# Patient Record
Sex: Female | Born: 1960 | Race: White | Hispanic: No | Marital: Married | Smoking: Never smoker
Health system: Southern US, Community
[De-identification: ages and names within clinical notes are randomized; demographics above are authoritative.]

## PROBLEM LIST (undated history)

## (undated) HISTORY — PX: TONSILLECTOMY: SUR1361

---

## 2004-12-20 ENCOUNTER — Ambulatory Visit: Payer: Self-pay | Admitting: Unknown Physician Specialty

## 2005-02-03 ENCOUNTER — Ambulatory Visit: Payer: Self-pay | Admitting: Unknown Physician Specialty

## 2014-09-20 ENCOUNTER — Emergency Department: Payer: BLUE CROSS/BLUE SHIELD

## 2014-09-20 ENCOUNTER — Encounter: Payer: Self-pay | Admitting: Emergency Medicine

## 2014-09-20 ENCOUNTER — Emergency Department
Admission: EM | Admit: 2014-09-20 | Discharge: 2014-09-20 | Disposition: A | Discharge status: Home / Self Care | Payer: BLUE CROSS/BLUE SHIELD | Attending: Emergency Medicine | Admitting: Emergency Medicine

## 2014-09-20 DIAGNOSIS — R079 Chest pain, unspecified: Secondary | ICD-10-CM | POA: Diagnosis not present

## 2014-09-20 DIAGNOSIS — R61 Generalized hyperhidrosis: Secondary | ICD-10-CM | POA: Diagnosis not present

## 2014-09-20 DIAGNOSIS — M549 Dorsalgia, unspecified: Secondary | ICD-10-CM | POA: Insufficient documentation

## 2014-09-20 DIAGNOSIS — Z79899 Other long term (current) drug therapy: Secondary | ICD-10-CM | POA: Diagnosis not present

## 2014-09-20 DIAGNOSIS — R202 Paresthesia of skin: Secondary | ICD-10-CM | POA: Diagnosis not present

## 2014-09-20 DIAGNOSIS — R110 Nausea: Secondary | ICD-10-CM | POA: Insufficient documentation

## 2014-09-20 DIAGNOSIS — Z791 Long term (current) use of non-steroidal anti-inflammatories (NSAID): Secondary | ICD-10-CM | POA: Insufficient documentation

## 2014-09-20 DIAGNOSIS — R0602 Shortness of breath: Secondary | ICD-10-CM | POA: Insufficient documentation

## 2014-09-20 LAB — BASIC METABOLIC PANEL
ANION GAP: 8 (ref 5–15)
BUN: 10 mg/dL (ref 6–20)
CALCIUM: 8.8 mg/dL — AB (ref 8.9–10.3)
CO2: 27 mmol/L (ref 22–32)
CREATININE: 0.68 mg/dL (ref 0.44–1.00)
Chloride: 105 mmol/L (ref 101–111)
GFR calc Af Amer: >60 mL/min (ref >60)
GLUCOSE: 128 mg/dL — AB (ref 65–99)
Potassium: 4.2 mmol/L (ref 3.5–5.1)
Sodium: 140 mmol/L (ref 135–145)

## 2014-09-20 LAB — TROPONIN I

## 2014-09-20 LAB — CBC
HCT: 38.6 % (ref 35.0–47.0)
Hemoglobin: 12.9 g/dL (ref 12.0–16.0)
MCH: 34.2 pg — ABNORMAL HIGH (ref 26.0–34.0)
MCHC: 33.5 g/dL (ref 32.0–36.0)
MCV: 102.1 fL — ABNORMAL HIGH (ref 80.0–100.0)
PLATELETS: 271 10*3/uL (ref 150–440)
RBC: 3.78 MIL/uL — ABNORMAL LOW (ref 3.80–5.20)
RDW: 13.7 % (ref 11.5–14.5)
WBC: 6 10*3/uL (ref 3.6–11.0)

## 2014-09-20 LAB — FIBRIN DERIVATIVES D-DIMER (ARMC ONLY): Fibrin derivatives D-dimer (ARMC): 402 (ref 0–499)

## 2014-09-20 NOTE — ED Notes (Addendum)
[place] way home in the car she began to have chest pain and back pain along with SOB. Pt also states she has Numbness and tingling in her left arm Pt states I feel like I cant take a deep breath.

## 2014-09-20 NOTE — ED Notes (Signed)
[place]. Was driving home and began to have pain to left back, left shoulder and left chest. States she got home and panicked. Thinks now it may be more musculoskeletal. Possible rib fracture.

## 2014-09-20 NOTE — Discharge Instructions (Signed)

## 2014-09-20 NOTE — ED Provider Notes (Signed)
[facility] Emergency Department Provider Note  ____________________________________________  Time seen: Approximately 650 PM  I have reviewed the triage vital signs and the nursing notes.   HISTORY  Chief Complaint Chest Pain    HPI Alicia Little is a 54 y.o. female without any chronic medical problems who is presenting today with an episode of chest pain. She says that last week he injured her back when she bumped it up against a dock. She said that she was resting her back over the course the week but then today was lifting some heavy rocks. After lifting the rocks, at 4 PM she began to have back as well as chest pain. She describes the chest pain as a tightness and is associated with shortness of breath.She said that the pain lasted for about an hour and then gradually went away. However, she still has pain to the left chest with deep breathing. She says that during this episode of pain she also with left arm tingling as well as nausea and diaphoresis. She denies any personal or family history of cardiac disease. She denies smoking or drinking. Denies any drug use.   History reviewed. No pertinent past medical history.  There are no active problems to display for this patient.   Past Surgical History  Procedure Laterality Date  . Tonsillectomy      Current Outpatient Rx  Name  Route  Sig  Dispense  Refill  . cycloSPORINE (RESTASIS) 0.05 % ophthalmic emulsion   Ophthalmic   Apply 1 drop to eye daily.         . naproxen sodium (ALEVE) 220 MG tablet   Oral   Take 440 mg by mouth once.         Marland Kitchen zolpidem (AMBIEN) 5 MG tablet   Oral   Take 2.5 mg by mouth at bedtime as needed. For sleep.      5     Allergies Review of patient's allergies indicates no known allergies.  No family history on file.  Social History Social History  Substance Use Topics  . Smoking status: Never Smoker   . Smokeless tobacco: Never Used  . Alcohol Use: 0.6  oz/week    1 Glasses of wine per week    Review of Systems Constitutional: No fever/chills Eyes: No visual changes. ENT: No sore throat. Cardiovascular: As above  Respiratory: As above Gastrointestinal: No abdominal pain.    No diarrhea.  No constipation. Genitourinary: Negative for dysuria. Musculoskeletal: Negative for back pain. Skin: Negative for rash. Neurological: Negative for headaches, focal weakness or numbness.  10-point ROS otherwise negative.  ____________________________________________   PHYSICAL EXAM:  VITAL SIGNS: ED Triage Vitals  Enc Vitals Group     BP 09/20/14 1721 114/67 mmHg     Pulse Rate 09/20/14 1721 73     Resp 09/20/14 1721 18     Temp 09/20/14 1721 97.9 F (36.6 C)     Temp Source 09/20/14 1721 Oral     SpO2 09/20/14 1721 100 %     Weight 09/20/14 1721 111 lb (50.349 kg)     Height 09/20/14 1721 5\' 4"  (1.626 m)     Head Cir --      Peak Flow --      Pain Score 09/20/14 1714 1     Pain Loc --      Pain Edu? --      Excl. in GC? --     Constitutional: Alert and oriented. Well appearing and  in no acute distress. Eyes: Conjunctivae are normal. PERRL. EOMI. Head: Atraumatic. Nose: No congestion/rhinnorhea. Mouth/Throat: Mucous membranes are moist.  Oropharynx non-erythematous. Neck: No stridor.   Cardiovascular: Normal rate, regular rhythm. Grossly normal heart sounds.  Good peripheral circulation. Chest pain is not reproducible. There are intact and equal bilateral radial as well as dorsalis pedis pulses. Respiratory: Normal respiratory effort.  No retractions. Lungs CTAB. Gastrointestinal: Soft and nontender. No distention. No abdominal bruits. No CVA tenderness. Musculoskeletal: No lower extremity tenderness nor edema.  No joint effusions. Neurologic:  Normal speech and language. No gross focal neurologic deficits are appreciated. No gait instability. Skin:  Skin is warm, dry and intact. No rash noted. Psychiatric: Mood and affect are  normal. Speech and behavior are normal.  ____________________________________________   LABS (all labs ordered are listed, but only abnormal results are displayed)  Labs Reviewed  BASIC METABOLIC PANEL - Abnormal; Notable for the following:    Glucose, Bld 128 (*)    Calcium 8.8 (*)    All other components within normal limits  CBC - Abnormal; Notable for the following:    RBC 3.78 (*)    MCV 102.1 (*)    MCH 34.2 (*)    All other components within normal limits  TROPONIN I  TROPONIN I  FIBRIN DERIVATIVES D-DIMER ([facility] ONLY)   ____________________________________________  EKG  ED ECG REPORT I, Arelia Longest, the attending physician, personally viewed and interpreted this ECG.   Date: 09/20/2014  EKG Time: 1714  Rate: 70  Rhythm: normal sinus rhythm  Axis: Normal axis  Intervals:none  ST&T Change: No ST elevations or depressions. No abnormal T-wave inversions.  ____________________________________________  RADIOLOGY  No acute abnormality on the chest x-ray or CAT scan of the brain. I personally reviewed the plain films. ____________________________________________   PROCEDURES   ____________________________________________   INITIAL IMPRESSION / ASSESSMENT AND PLAN / ED COURSE  Pertinent labs & imaging results that were available during my care of the patient were reviewed by me and considered in my medical decision making (see chart for details).  Patient low risk for pulmonary embolus as well as acute coronary syndrome. However, does have some classic features including diaphoresis as well as shortness of breath and nausea. Also with pleuritic chest pain. This could be all musculoskeletal and I favor this secondary to her recent musculoskeletal injury as well as exertion. However, I cannot use the PERC rule to rule out her out because she is over 55 years old. We will check a second troponin as well as test for a  d-dimer.  ----------------------------------------- 9:23 PM on 09/20/2014 -----------------------------------------  She resting comfortably now with negative d-dimer as well as negative troponin. We'll discharge to home. Extremely low risk for cardiac pathology as well as pulmonary embolus. Possible panic attack. Patient says was feeling "out of control" when she was at home because of the pain that she was having. Possible anxiety versus panic reaction. Will follow up with primary care doctor we'll discharge to home. Likely musculoskeletal pain causing chest and back pain given recent injury and exertion.  ____________________________________________   FINAL CLINICAL IMPRESSION(S) / ED DIAGNOSES  Acute chest pain. Initial visit.    Myrna Blazer, MD 09/20/14 2124

## 2014-10-13 ENCOUNTER — Other Ambulatory Visit: Payer: Self-pay | Admitting: Internal Medicine

## 2014-10-13 DIAGNOSIS — R0789 Other chest pain: Secondary | ICD-10-CM

## 2014-10-14 ENCOUNTER — Ambulatory Visit
Admission: RE | Admit: 2014-10-14 | Discharge: 2014-10-14 | Disposition: A | Discharge status: Home / Self Care | Payer: BLUE CROSS/BLUE SHIELD | Source: Ambulatory Visit | Attending: Internal Medicine | Admitting: Internal Medicine

## 2014-10-14 DIAGNOSIS — R911 Solitary pulmonary nodule: Secondary | ICD-10-CM | POA: Insufficient documentation

## 2014-10-14 DIAGNOSIS — R0789 Other chest pain: Secondary | ICD-10-CM | POA: Diagnosis present

## 2014-10-14 MED ORDER — IOHEXOL 350 MG/ML SOLN
85.0000 mL | Freq: Once | INTRAVENOUS | Status: AC | PRN
  Administered 2014-10-14: 85 mL via INTRAVENOUS

## 2017-02-09 IMAGING — CT CT HEAD W/O CM
1 series · 16 of 30 positions shown, 20 images · non-contrast
Comparison: None.

CLINICAL DATA: Sudden onset of LEFT-sided arm numbness, patient
fell 1 week ago.

EXAM:
CT HEAD WITHOUT CONTRAST
TECHNIQUE: Contiguous axial images were obtained from the base of the skull
through the vertex without intravenous contrast.

[Series 2: head wo · axial · 0.45mm/px · z∈[-179,-48]mm · 16 of 30 slices shown, 20 images]
[im 2/30  brain]
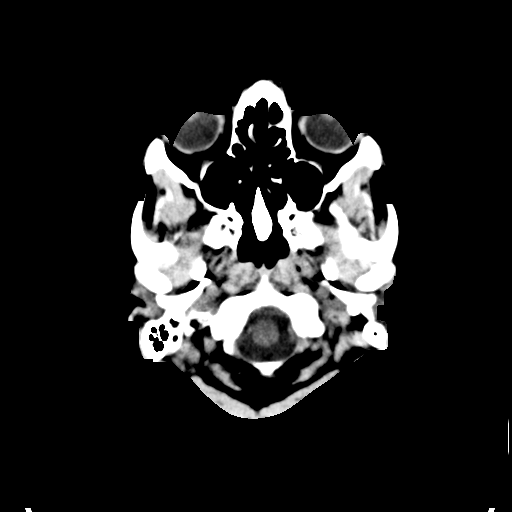
[im 2/30  bone]
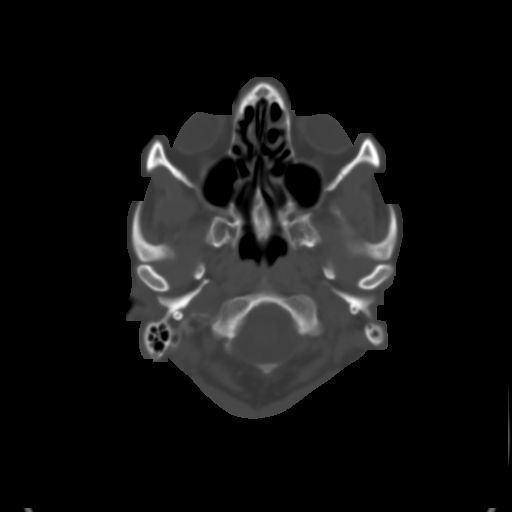
[im 4/30  brain]
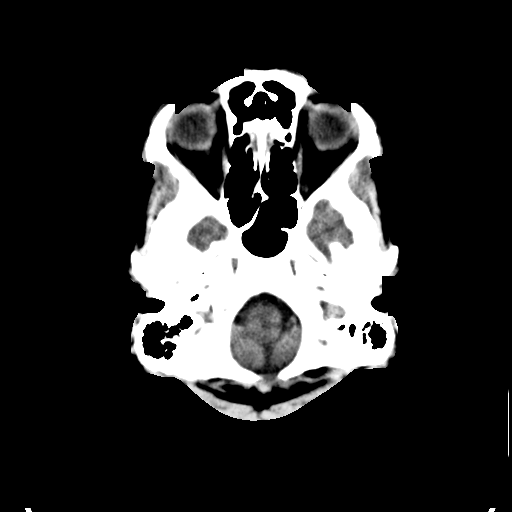
[im 6/30  brain]
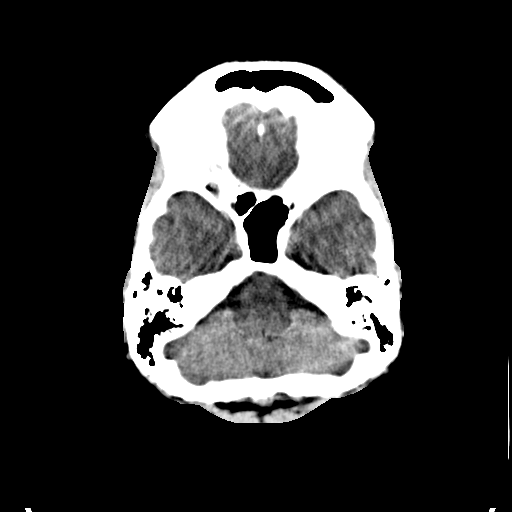
[im 8/30  brain]
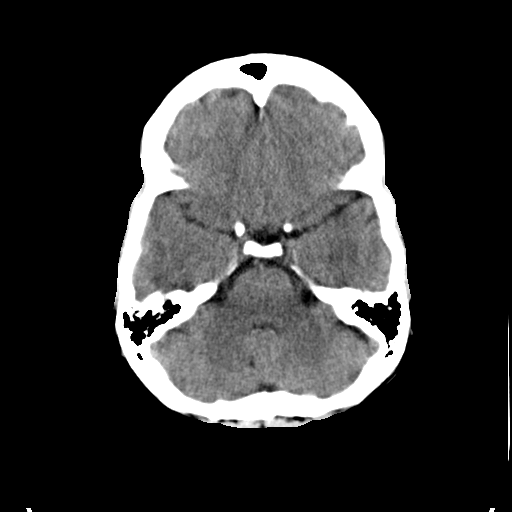
[im 9/30  brain]
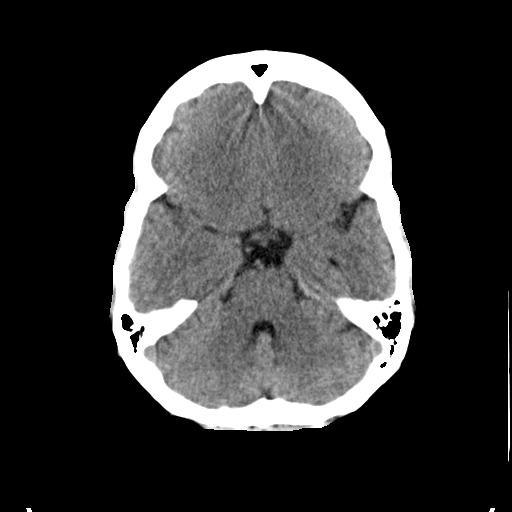
[im 9/30  bone]
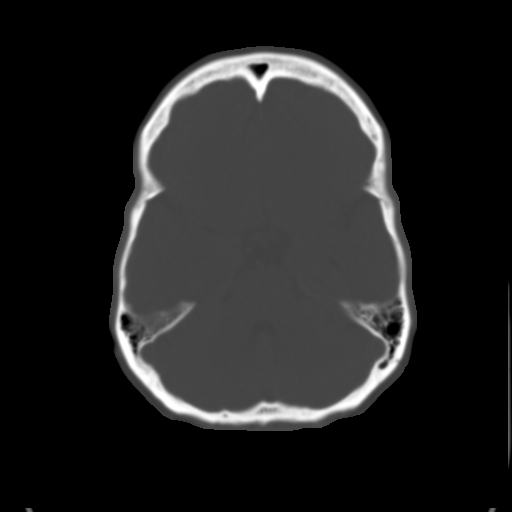
[im 11/30  brain]
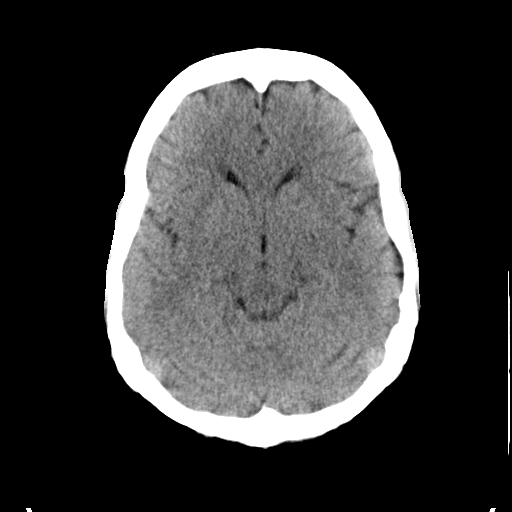
[im 13/30  brain]
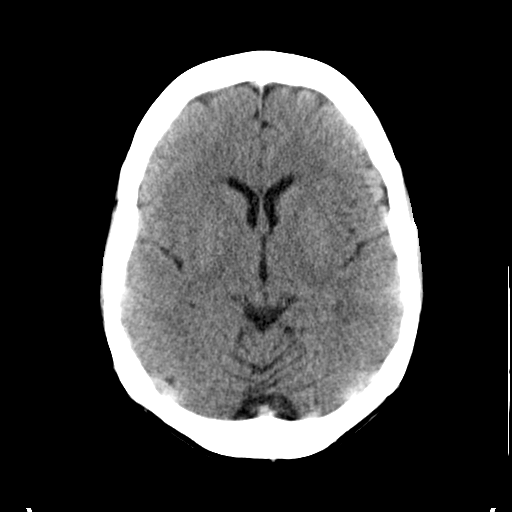
[im 15/30  brain]
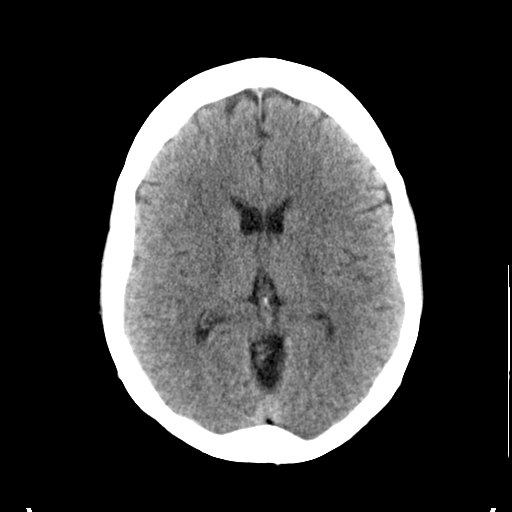
[im 16/30  brain]
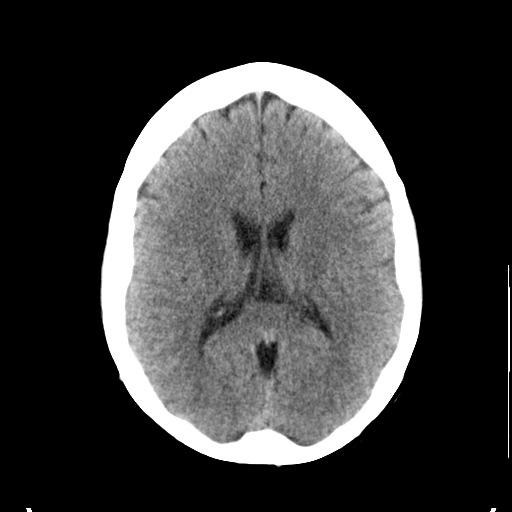
[im 16/30  bone]
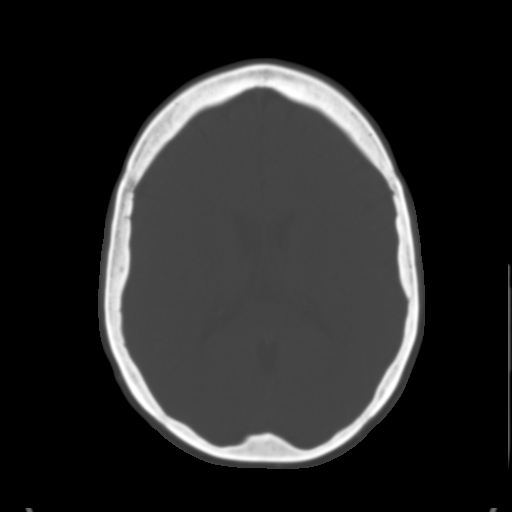
[im 18/30  brain]
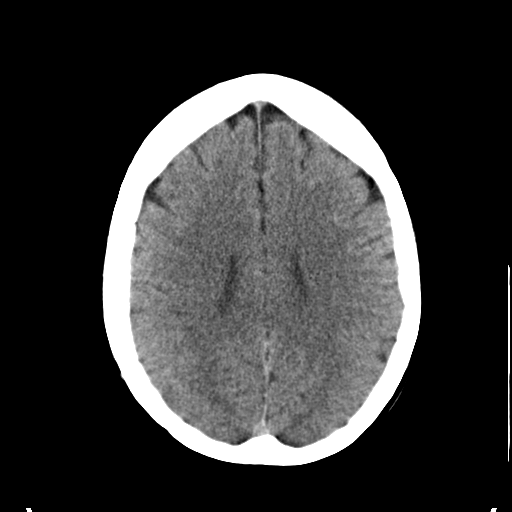
[im 20/30  brain]
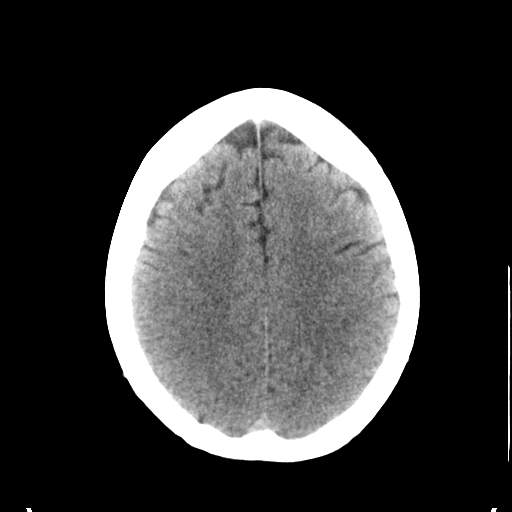
[im 22/30  brain]
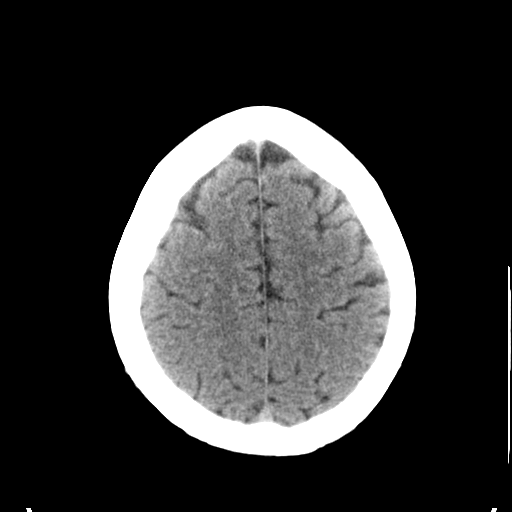
[im 23/30  brain]
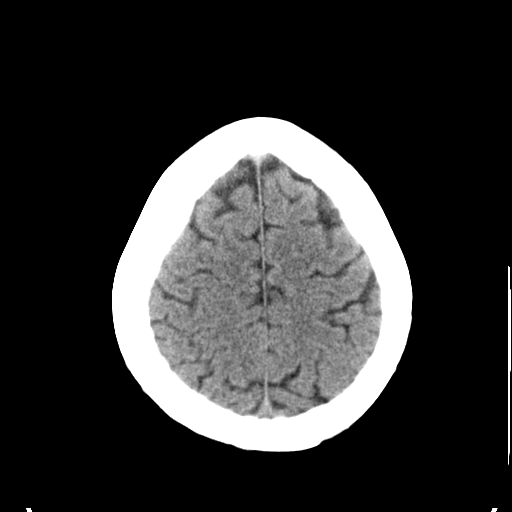
[im 23/30  bone]
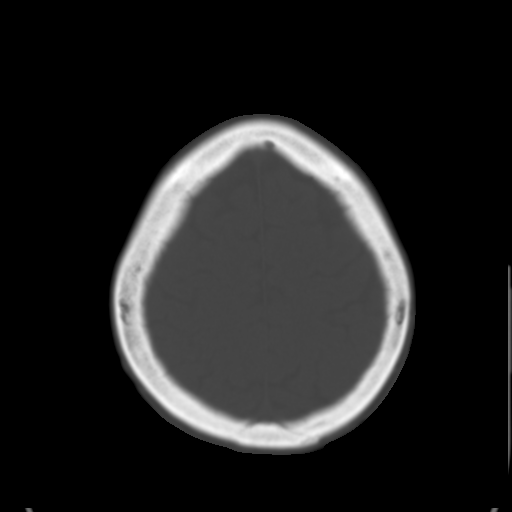
[im 25/30  brain]
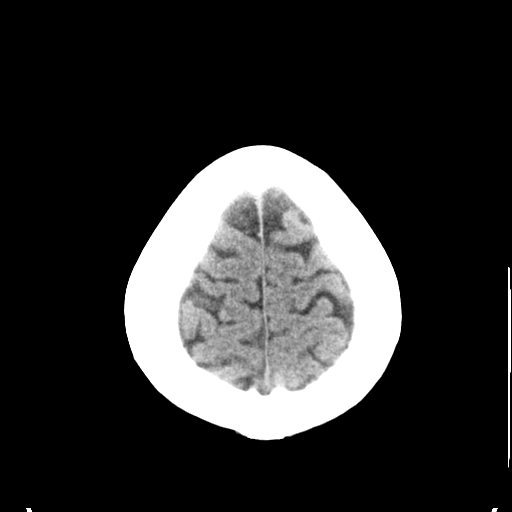
[im 27/30  brain]
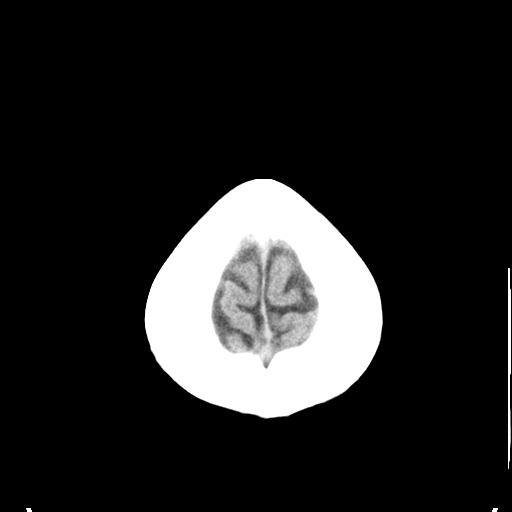
[im 29/30  brain]
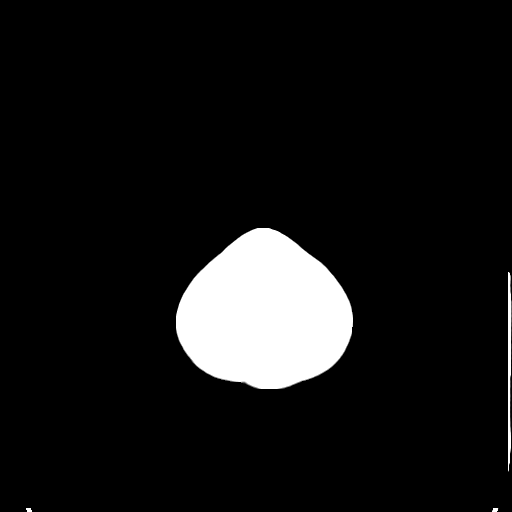

[16 of 30 positions shown; findings below may reference images not displayed]

FINDINGS: No evidence for acute infarction, hemorrhage, mass lesion,
hydrocephalus, or extra-axial fluid. No atrophy or white matter
disease. Intact calvarium. No acute sinus or mastoid disease.
IMPRESSION: Negative exam.

## 2020-09-19 ENCOUNTER — Other Ambulatory Visit: Payer: Self-pay

## 2020-09-19 ENCOUNTER — Emergency Department: Payer: BC Managed Care – PPO

## 2020-09-19 ENCOUNTER — Emergency Department
Admission: EM | Admit: 2020-09-19 | Discharge: 2020-09-20 | Disposition: A | Discharge status: Home / Self Care | Payer: BC Managed Care – PPO | Attending: Emergency Medicine | Admitting: Emergency Medicine

## 2020-09-19 DIAGNOSIS — S40011A Contusion of right shoulder, initial encounter: Secondary | ICD-10-CM | POA: Diagnosis not present

## 2020-09-19 DIAGNOSIS — S40012A Contusion of left shoulder, initial encounter: Secondary | ICD-10-CM | POA: Insufficient documentation

## 2020-09-19 DIAGNOSIS — S0181XA Laceration without foreign body of other part of head, initial encounter: Secondary | ICD-10-CM | POA: Diagnosis not present

## 2020-09-19 DIAGNOSIS — W108XXA Fall (on) (from) other stairs and steps, initial encounter: Secondary | ICD-10-CM | POA: Diagnosis not present

## 2020-09-19 DIAGNOSIS — S0990XA Unspecified injury of head, initial encounter: Secondary | ICD-10-CM | POA: Diagnosis present

## 2020-09-19 DIAGNOSIS — W19XXXA Unspecified fall, initial encounter: Secondary | ICD-10-CM

## 2020-09-19 DIAGNOSIS — Z23 Encounter for immunization: Secondary | ICD-10-CM | POA: Insufficient documentation

## 2020-09-19 MED ORDER — CEPHALEXIN 500 MG PO CAPS
500.0000 mg | ORAL_CAPSULE | Freq: Once | ORAL | Status: AC
  Administered 2020-09-20: 500 mg via ORAL
  Filled 2020-09-19: qty 1

## 2020-09-19 MED ORDER — TETANUS-DIPHTH-ACELL PERTUSSIS 5-2.5-18.5 LF-MCG/0.5 IM SUSY
0.5000 mL | PREFILLED_SYRINGE | Freq: Once | INTRAMUSCULAR | Status: AC
  Administered 2020-09-19: 0.5 mL via INTRAMUSCULAR
  Filled 2020-09-19: qty 0.5

## 2020-09-19 MED ORDER — BACITRACIN-NEOMYCIN-POLYMYXIN 400-5-5000 EX OINT
TOPICAL_OINTMENT | CUTANEOUS | Status: AC
  Filled 2020-09-19: qty 1

## 2020-09-19 NOTE — ED Provider Notes (Signed)
[facility] Emergency Department Provider Note   ____________________________________________   Event Date/Time   First MD Initiated Contact with Patient 09/19/20 2249     (approximate)  I have reviewed the triage vital signs and the nursing notes.   HISTORY  Chief Complaint Fall   HPI Alicia Little is a 60 y.o. female patient at an engagement party for her son.  She fell down several stairs uncertain when she hit her head on but she has a deep forehead laceration and bruising on the left posterior shoulder and right anterior shoulder.  No known loss of consciousness.  She is not having nausea or vomiting or any pain.  She did have some drinks at the party.       History reviewed. No pertinent past medical history.  There are no problems to display for this patient.   Past Surgical History:  Procedure Laterality Date   TONSILLECTOMY      Prior to Admission medications   Medication Sig Start Date End Date Taking? Authorizing Provider  cycloSPORINE (RESTASIS) 0.05 % ophthalmic emulsion Apply 1 drop to eye daily. 12/30/13   [provider]  naproxen sodium (ALEVE) 220 MG tablet Take 440 mg by mouth once.    [provider]  zolpidem (AMBIEN) 5 MG tablet Take 2.5 mg by mouth at bedtime as needed. For sleep. 09/08/14   [provider]    Allergies Patient has no known allergies.  History reviewed. No pertinent family history.  Social History Social History   Tobacco Use   Smoking status: Never   Smokeless tobacco: Never  Substance Use Topics   Alcohol use: Yes    Alcohol/week: 1.0 standard drink    Types: 1 Glasses of wine per week   Drug use: No    Review of Systems  Constitutional: No fever/chills Eyes: No visual changes. ENT: No sore throat. Cardiovascular: Denies chest pain. Respiratory: Denies shortness of breath. Gastrointestinal: No abdominal pain.  No nausea, no vomiting.  No diarrhea.  No  constipation. Genitourinary: Negative for dysuria. Musculoskeletal: Negative for back pain. Skin: Negative for rash. Neurological: Negative for headaches, focal weakness   ____________________________________________   PHYSICAL EXAM:  VITAL SIGNS: ED Triage Vitals  Enc Vitals Group     BP 09/19/20 2203 (!) 142/87     Pulse Rate 09/19/20 2203 70     Resp 09/19/20 2203 18     Temp 09/19/20 2203 97.8 F (36.6 C)     Temp Source 09/19/20 2203 Oral     SpO2 09/19/20 2203 98 %     Weight 09/19/20 2204 120 lb (54.4 kg)     Height --      Head Circumference --      Peak Flow --      Pain Score 09/19/20 2204 0     Pain Loc --      Pain Edu? --      Excl. in GC? --     Constitutional: Alert and oriented. Well appearing and in no acute distress. Eyes: Conjunctivae are normal. PERRL. EOMI. Head: Atraumatic except for approximately half centimeter wide 2 and half centimeter long laceration over the forehead with bone exposed. Nose: No congestion/rhinnorhea.  There is also a small horizontal deep scratch over the bridge of the nose about 3 mm long Mouth/Throat: Mucous membranes are moist.  Oropharynx non-erythematous. Neck: No stridor.  No cervical spine tenderness to palpation.  Patient of course has several distracting things going on Cardiovascular:  Normal rate, regular rhythm. Grossly normal heart sounds.  Good peripheral circulation. Respiratory: Normal respiratory effort.  No retractions. Lungs CTAB. Gastrointestinal: Soft and nontender. No distention. No abdominal bruits.  Musculoskeletal: No lower extremity tenderness nor edema.  Arms are also normal there is no bony tenderness in the arms legs back chest or ribs Neurologic:  Normal speech and language. No gross focal neurologic deficits are appreciated. No gait instability. Skin:  Skin is warm, dry and intact except for as noted above no rash noted.   ____________________________________________   LABS (all labs ordered are  listed, but only abnormal results are displayed)  Labs Reviewed - No data to display ____________________________________________  EKG   ____________________________________________  RADIOLOGY Jill Poling, personally viewed and evaluated these images (plain radiographs) as part of my medical decision making, as well as reviewing the written report by the radiologist.  ED MD interpretation: CT of the head and c- spine read by radiology reviewed by me showing no intracranial or bony injuries  Official radiology report(s): CT HEAD WO CONTRAST ( )  Result Date: 09/19/2020 CLINICAL DATA:  Fall down stairs, left forehead laceration EXAM: CT HEAD WITHOUT CONTRAST CT CERVICAL SPINE WITHOUT CONTRAST TECHNIQUE: Multidetector CT imaging of the head and cervical spine was performed following the standard protocol without intravenous contrast. Multiplanar CT image reconstructions of the cervical spine were also generated. COMPARISON:  CT head dated 09/20/2014 FINDINGS: CT HEAD FINDINGS Brain: No evidence of acute infarction, hemorrhage, hydrocephalus, extra-axial collection or mass lesion/mass effect. Mild subcortical white matter and periventricular small vessel ischemic changes. Vascular: No hyperdense vessel or unexpected calcification. Skull: Normal. Negative for fracture or focal lesion. Sinuses/Orbits: The visualized paranasal sinuses are essentially clear. The mastoid air cells are unopacified. Other: Mild soft tissue swelling with a deep laceration overlying the left frontal bone (series 2/image 21). CT CERVICAL SPINE FINDINGS Alignment: Normal cervical lordosis. Skull base and vertebrae: No acute fracture. No primary bone lesion or focal pathologic process. Soft tissues and spinal canal: No prevertebral fluid or swelling. No visible canal hematoma. Disc levels: Mild degenerative changes of the mid cervical spine. Spinal canal is patent. Upper chest: Visualized lung apices are notable for mild  biapical pleural-parenchymal scarring. Other: Visualized thyroid is unremarkable. IMPRESSION: Mild soft tissue swelling with a deep laceration overlying the left frontal bone. No evidence of calvarial fracture. No evidence of acute intracranial abnormality. Mild small vessel ischemic changes. No evidence of traumatic injury to the cervical spine. Mild degenerative changes. Electronically Signed   By: Charline Bills M.D.   On: 09/19/2020 22:48   CT CERVICAL SPINE WO CONTRAST  Result Date: 09/19/2020 CLINICAL DATA:  Fall down stairs, left forehead laceration EXAM: CT HEAD WITHOUT CONTRAST CT CERVICAL SPINE WITHOUT CONTRAST TECHNIQUE: Multidetector CT imaging of the head and cervical spine was performed following the standard protocol without intravenous contrast. Multiplanar CT image reconstructions of the cervical spine were also generated. COMPARISON:  CT head dated 09/20/2014 FINDINGS: CT HEAD FINDINGS Brain: No evidence of acute infarction, hemorrhage, hydrocephalus, extra-axial collection or mass lesion/mass effect. Mild subcortical white matter and periventricular small vessel ischemic changes. Vascular: No hyperdense vessel or unexpected calcification. Skull: Normal. Negative for fracture or focal lesion. Sinuses/Orbits: The visualized paranasal sinuses are essentially clear. The mastoid air cells are unopacified. Other: Mild soft tissue swelling with a deep laceration overlying the left frontal bone (series 2/image 21). CT CERVICAL SPINE FINDINGS Alignment: Normal cervical lordosis. Skull base and vertebrae: No acute fracture. No primary bone  lesion or focal pathologic process. Soft tissues and spinal canal: No prevertebral fluid or swelling. No visible canal hematoma. Disc levels: Mild degenerative changes of the mid cervical spine. Spinal canal is patent. Upper chest: Visualized lung apices are notable for mild biapical pleural-parenchymal scarring. Other: Visualized thyroid is unremarkable.  IMPRESSION: Mild soft tissue swelling with a deep laceration overlying the left frontal bone. No evidence of calvarial fracture. No evidence of acute intracranial abnormality. Mild small vessel ischemic changes. No evidence of traumatic injury to the cervical spine. Mild degenerative changes. Electronically Signed   By: Charline Bills M.D.   On: 09/19/2020 22:48    ____________________________________________   PROCEDURES  Procedure(s) performed (including Critical Care):  Procedures   ____________________________________________   INITIAL IMPRESSION / ASSESSMENT AND PLAN / ED COURSE   Dr. Jenne Campus is here to repair the laceration. We will reevaluate the patient when he is done.  This will make sure there are no lingering effects of the ethanol which she likely ingested.  I have signed her out to the oncoming physician.          ____________________________________________   FINAL CLINICAL IMPRESSION(S) / ED DIAGNOSES  Final diagnoses:  Fall, initial encounter  Forehead laceration, initial encounter     ED Discharge Orders     None        Note:  This document was prepared using Dragon voice recognition software and may include unintentional dictation errors.    Arnaldo Natal, MD 09/19/20 [phone]

## 2020-09-19 NOTE — Discharge Instructions (Addendum)
Please follow up with Dr. Jenne Campus as scheduled.  Please take antibiotics until complete.    Your CT head and cervical spine showed no acute abnormality other than soft tissue laceration.

## 2020-09-19 NOTE — ED Notes (Signed)
Laceration dressed with gauze pad and gauze wrap.

## 2020-09-19 NOTE — ED Triage Notes (Addendum)
Pt states she fell down several stairs, pt unsure what she hit her head on or that the injury was as bad as it is. Deep laceration noted on left forehead, bleeding controlled without pressure. Small abrasion noted on bridge of nose. Odor of ETOH noted- pt reports she was drinking as she was at son's engagement party. Pt denies LOC, N/V, CP, other pain/injury.

## 2020-09-20 MED ORDER — CEPHALEXIN 500 MG PO CAPS: 500.0000 mg | ORAL_CAPSULE | Freq: Three times a day (TID) | ORAL | 0 refills | Status: AC

## 2020-09-20 MED ORDER — GENTAMICIN SULFATE 0.1 % EX OINT: 1.0000 "application " | TOPICAL_OINTMENT | Freq: Two times a day (BID) | CUTANEOUS | 0 refills | Status: AC

## 2020-09-20 NOTE — ED Provider Notes (Addendum)
  Physical Exam  BP (!) 142/87 (BP Location: Right Arm)   Pulse 70   Temp 97.8 F (36.6 C) (Oral)   Resp 18   Wt 54.4 kg   SpO2 98%   BMI 20.60 kg/m   Physical Exam  ED Course/Procedures     Procedures  MDM  12:30 AM  Assumed care patient at signout.  Patient here with mechanical fall.  CT head and cervical spine unremarkable other than soft tissue laceration that was repaired by Dr. Jenne Campus with ENT.  She denies any other pain at this time.  She states she is very eager for discharge.  Will p.o. challenge, ambulate.  Discharged with Keflex and gentamicin ointment per ENT recommendations.  I reviewed all nursing notes and pertinent previous records as available.  I have reviewed and interpreted any EKGs, lab and urine results, imaging (as available).        Pacer Dorn, Layla Maw, DO 09/20/20 0034   4:00 AM  Received a call from radiologist.  He reviewed her images and found a small fracture of the left anterior corner of C5 and a C5 spinous process fracture suggestive of a flexion extension injury.  She was ambulatory here in the emergency department.  Discussed this case with Dr. Myer Haff on call with NSG.  He states that he will review her imaging, contact the patient in the morning and arrange outpatient follow-up and imaging as needed.  He states that he will take care of this in the morning and no need to wake the patient up in the middle of the night.  Appreciate NSG help.   Marshal Eskew, Layla Maw, DO 09/20/20 [phone]

## 2020-09-20 NOTE — ED Notes (Signed)
Pt NAD, a/ox4. Pt has dressed bandaged over forehead. Pt and spouse verbalize understanding of all DC and f/u instructions. All questions answered. Pt and spouse refuse w/c. Pt walks with steady gait to lobby at DC with spouse at side.

## 2020-09-20 NOTE — Consult Note (Signed)
Alicia Little, Alicia Little [id number] 1960-12-31 Arnaldo Natal, MD  Reason for Consult: Head trauma with left forehead facial laceration complex  HPI: 60 year old female accidentally fell down a flight of stairs stating the injury to her left forehead.  There was no loss of consciousness.  She has had no other signs of central nervous system injury.  She has been evaluated by emergency staff CT scan showed no intracranial pathology frontal sinus appeared to be intact there was no external table fracture, the cervical spine appeared to be intact.  Allergies: No Known Allergies  ROS: Review of systems normal other than 12 systems except per HPI.  PMH: History reviewed. No pertinent past medical history.  FH: History reviewed. No pertinent family history.  SH:  Social History   Socioeconomic History   Marital status: Married    Spouse name: Not on file   Number of children: Not on file   Years of education: Not on file   Highest education level: Not on file  Occupational History   Not on file  Tobacco Use   Smoking status: Never   Smokeless tobacco: Never  Substance and Sexual Activity   Alcohol use: Yes    Alcohol/week: 1.0 standard drink    Types: 1 Glasses of wine per week   Drug use: No   Sexual activity: Not on file  Other Topics Concern   Not on file  Social History Narrative   Not on file   Social Determinants of Health   Financial Resource Strain: Not on file  Food Insecurity: Not on file  Transportation Needs: Not on file  Physical Activity: Not on file  Stress: Not on file  Social Connections: Not on file  Intimate Partner Violence: Not on file    PSH:  Past Surgical History:  Procedure Laterality Date   TONSILLECTOMY      Physical  Exam: Patient is awake and alert.  He was given a tetanus shot in the emergency room.  Examination showed the facial nerve to be intact she has an inverted Y shaped laceration beginning just above the eyebrow on the left-hand side  extending superiorly halfway to the hairline.  This extends down to the pericranium.  There was numbness in the pattern of the supraorbital nerve.  She was able to lift her eyebrow up on the left-hand side.  Procedure: Complex repair of left supraorbital laceration with an inverted Y shape extending down to the pericranium.  The laceration measured proximately 3 cm in each inverted while in and approximately 4 cm in the superior straight limb.  Procedure note after Velisa's consent a local anesthetic of 2% lidocaine with 1 100,000 units of epinephrine was used to inject the wound.  A total of 8 cc was used.  The wound was then clearly thoroughly cleaned and scrubbed with Betadine.  The area was then prepped and draped sterilely.  There were several denuded pieces of tissue which were debrided using short sharp scissors.  Expiration of the wound I could feel no evidence of the deformity of the frontal sinus.  Beginning with a 5-0 Vicryl the supra orbital tissue just lateral to the pericranium was closed using interrupted 5-0 Vicryl sutures.  The subcutaneous and muscular tissues were then closed using interrupted 5-0 Vicryl as well.  Approximately 8 stitches were used.  The skin was reapproximated using a interrupted 5-0 nylon.  This gave excellent closure to the wound.  Bacitracin ointment was then applied followed by a compressive dressing.   A/P:  Complex left forehead laceration repaired.  Patient will be discharged on Keflex 500 3 times daily for 10 days and gentamicin ointment applied twice daily.  Have instructed her husband to apply a compressive dressing and apply ice to prevent hematoma.  They understand.  They will schedule a follow-up appointment with me in 10 days for suture removal.  They will contact me over the weekend should symptoms worsen.  Emergency room time approximately 1.5 hours   Davina Poke 09/20/2020 12:37 AM

## 2022-12-13 ENCOUNTER — Ambulatory Visit: Admit: 2022-12-13 | Payer: BC Managed Care – PPO

## 2022-12-13 SURGERY — COLONOSCOPY WITH PROPOFOL
Anesthesia: General

## 2023-02-02 ENCOUNTER — Ambulatory Visit: Payer: BC Managed Care – PPO

## 2023-02-02 DIAGNOSIS — K209 Esophagitis, unspecified without bleeding: Secondary | ICD-10-CM | POA: Diagnosis not present

## 2023-02-02 DIAGNOSIS — Z1211 Encounter for screening for malignant neoplasm of colon: Secondary | ICD-10-CM | POA: Diagnosis present

## 2023-02-02 DIAGNOSIS — K2960 Other gastritis without bleeding: Secondary | ICD-10-CM | POA: Diagnosis not present

## 2024-01-18 ENCOUNTER — Other Ambulatory Visit: Payer: Self-pay | Admitting: Internal Medicine

## 2024-01-18 DIAGNOSIS — E782 Mixed hyperlipidemia: Secondary | ICD-10-CM

## 2024-02-07 ENCOUNTER — Ambulatory Visit
Admission: RE | Admit: 2024-02-07 | Discharge: 2024-02-07 | Disposition: A | Discharge status: Home / Self Care | Payer: Self-pay | Source: Ambulatory Visit | Attending: Internal Medicine | Admitting: Internal Medicine

## 2024-02-07 DIAGNOSIS — E782 Mixed hyperlipidemia: Secondary | ICD-10-CM | POA: Insufficient documentation
